# Patient Record
Sex: Male | Born: 1995 | Race: White | Hispanic: No | Marital: Single | Smoking: Never smoker
Health system: Southern US, Community
[De-identification: ages and names within clinical notes are randomized; demographics above are authoritative.]

## PROBLEM LIST (undated history)

## (undated) DIAGNOSIS — K5909 Other constipation: Secondary | ICD-10-CM

## (undated) DIAGNOSIS — R110 Nausea: Secondary | ICD-10-CM

## (undated) DIAGNOSIS — R109 Unspecified abdominal pain: Secondary | ICD-10-CM

## (undated) DIAGNOSIS — J45909 Unspecified asthma, uncomplicated: Secondary | ICD-10-CM

## (undated) HISTORY — PX: HERNIA REPAIR: SHX51

## (undated) HISTORY — DX: Nausea: R11.0

## (undated) HISTORY — DX: Unspecified abdominal pain: R10.9

## (undated) HISTORY — PX: TOOTH EXTRACTION: SUR596

## (undated) HISTORY — DX: Other constipation: K59.09

---
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
## 2017-12-05 ENCOUNTER — Ambulatory Visit (INDEPENDENT_AMBULATORY_CARE_PROVIDER_SITE_OTHER): Payer: Managed Care, Other (non HMO)

## 2017-12-05 ENCOUNTER — Encounter: Payer: Self-pay | Admitting: Emergency Medicine

## 2017-12-05 ENCOUNTER — Ambulatory Visit
Admission: EM | Admit: 2017-12-05 | Discharge: 2017-12-05 | Disposition: A | Discharge status: Home / Self Care | Payer: Managed Care, Other (non HMO) | Attending: Family Medicine | Admitting: Family Medicine

## 2017-12-05 ENCOUNTER — Other Ambulatory Visit: Payer: Self-pay

## 2017-12-05 DIAGNOSIS — S9031XA Contusion of right foot, initial encounter: Secondary | ICD-10-CM

## 2017-12-05 MED ORDER — NAPROXEN 500 MG PO TABS: 500.0000 mg | ORAL_TABLET | Freq: Two times a day (BID) | ORAL | 0 refills | Status: DC | PRN

## 2017-12-05 NOTE — ED Triage Notes (Signed)
Patient c/o foot pain after dropping a log on his right foot. Patient reports swelling and bruising to the top of his right foot.

## 2017-12-05 NOTE — ED Provider Notes (Signed)
[facility]    CSN: [id number] Arrival date & time: 12/05/17  1825  History   Chief Complaint Chief Complaint  Patient presents with  . Foot Injury   HPI  22 year old male presents with a foot injury.  Injury occurred this morning.  Patient states that he was stacking logs of wood.  He dropped on the logs on his foot.  In doing so, he injured his foot.  Reports pain, bruising, and mild swelling on the dorsum of his foot at the level of the first MTP.  Patient is able to walk.  No medications or interventions tried.  No relieving factors.  No other complaints.  PMH, Surgical Hx, Social History reviewed and updated as below.  Past Medical History:  Diagnosis Date  . Abdominal pain, recurrent   . Chronic constipation   . Nausea    Patient Active Problem List   Diagnosis Date Noted  . Abdominal pain, recurrent   . Chronic constipation   . Nausea    Past Surgical History:  Procedure Laterality Date  . HERNIA REPAIR    . TOOTH EXTRACTION      Home Medications    Prior to Admission medications   Medication Sig Start Date End Date Taking? Authorizing Provider  esomeprazole (NEXIUM) 20 MG capsule Take 20 mg by mouth daily before breakfast.     Yes [provider]  methylphenidate (CONCERTA) 27 MG CR tablet Take 27 mg by mouth every morning.     Yes [provider]  naproxen (NAPROSYN) 500 MG tablet Take 1 tablet (500 mg total) by mouth 2 (two) times daily as needed for mild pain or moderate pain. 12/05/17   Tommie Sams, DO   Social History Social History   Tobacco Use  . Smoking status: Never Smoker  . Smokeless tobacco: Never Used  Substance Use Topics  . Alcohol use: Never    Frequency: Never  . Drug use: Never     Allergies   Patient has no known allergies.   Review of Systems Review of Systems  Constitutional: Negative.   Musculoskeletal:       Foot pain, injury (R)   Physical Exam Triage Vital Signs ED Triage Vitals  Enc  Vitals Group     BP 12/05/17 1842 122/82     Pulse Rate 12/05/17 1842 82     Resp 12/05/17 1842 18     Temp 12/05/17 1842 98.1 F (36.7 C)     Temp Source 12/05/17 1842 Oral     SpO2 12/05/17 1842 98 %     Weight 12/05/17 1837 207 lb 6.4 oz (94.1 kg)     Height 12/05/17 1837 6' (1.829 m)     Head Circumference --      Peak Flow --      Pain Score 12/05/17 1837 5     Pain Loc --      Pain Edu? --      Excl. in GC? --    Updated Vital Signs BP 122/82 (BP Location: Left Arm)   Pulse 82   Temp 98.1 F (36.7 C) (Oral)   Resp 18   Ht 6' (1.829 m)   Wt 94.1 kg   SpO2 98%   BMI 28.13 kg/m   Visual Acuity Right Eye Distance:   Left Eye Distance:   Bilateral Distance:    Right Eye Near:   Left Eye Near:    Bilateral Near:     Physical Exam  Constitutional:  He is oriented to person, place, and time. He appears well-developed. No distress.  Cardiovascular: Normal rate and regular rhythm.  Pulmonary/Chest: Effort normal. No respiratory distress.  Musculoskeletal:  Right foot - Mild bruising noted at the 1st MTP. Tender to palpation.  Neurological: He is alert and oriented to person, place, and time.  Psychiatric: He has a normal mood and affect. His behavior is normal.  Nursing note and vitals reviewed.  UC Treatments / Results  Labs (all labs ordered are listed, but only abnormal results are displayed) Labs Reviewed - No data to display  EKG None  Radiology Dg Foot Complete Right  Result Date: 12/05/2017 CLINICAL DATA:  Bruising and pain base of right great toe after dropping a log on foot EXAM: RIGHT FOOT COMPLETE - 3+ VIEW COMPARISON:  None. FINDINGS: No fracture or dislocation of mid foot or forefoot. The phalanges are normal. The calcaneus is normal. No soft tissue abnormality. IMPRESSION: No fracture or dislocation. Electronically Signed   By: Genevive Bi M.D.   On: 12/05/2017 19:01    Procedures Procedures (including critical care time)  Medications  Ordered in UC Medications - No data to display  Initial Impression / Assessment and Plan / UC Course  I have reviewed the triage vital signs and the nursing notes.  Pertinent labs & imaging results that were available during my care of the patient were reviewed by me and considered in my medical decision making (see chart for details).    22 year old male presents with a foot injury.  X-ray was negative.  Patient appears to just have a contusion.  Naproxen as directed.  Supportive care.  Final Clinical Impressions(s) / UC Diagnoses   Final diagnoses:  Contusion of right foot, initial encounter     Discharge Instructions     Rest, ice, elevation.  Naproxen as directed (you can use OTC ibuprofen instead if you like - 800 mg three times daily as needed).  Take care  Dr Adriana Simas    ED Prescriptions    Medication Sig Dispense Auth. Provider   naproxen (NAPROSYN) 500 MG tablet Take 1 tablet (500 mg total) by mouth 2 (two) times daily as needed for mild pain or moderate pain. 30 tablet Tommie Sams, DO     Controlled Substance Prescriptions [place] Controlled Substance Registry consulted? Not Applicable   Tommie Sams, DO 12/05/17 8676

## 2017-12-05 NOTE — Discharge Instructions (Signed)
Rest, ice, elevation.  Naproxen as directed (you can use OTC ibuprofen instead if you like - 800 mg three times daily as needed).  Take care  Dr Adriana Simas

## 2018-01-01 ENCOUNTER — Encounter: Payer: Self-pay | Admitting: Gynecology

## 2018-01-01 ENCOUNTER — Ambulatory Visit
Admission: EM | Admit: 2018-01-01 | Discharge: 2018-01-01 | Disposition: A | Discharge status: Home / Self Care | Payer: Managed Care, Other (non HMO) | Attending: Emergency Medicine | Admitting: Emergency Medicine

## 2018-01-01 ENCOUNTER — Ambulatory Visit (INDEPENDENT_AMBULATORY_CARE_PROVIDER_SITE_OTHER): Payer: Managed Care, Other (non HMO)

## 2018-01-01 DIAGNOSIS — M79672 Pain in left foot: Secondary | ICD-10-CM | POA: Diagnosis not present

## 2018-01-01 DIAGNOSIS — S93491A Sprain of other ligament of right ankle, initial encounter: Secondary | ICD-10-CM | POA: Diagnosis not present

## 2018-01-01 DIAGNOSIS — W19XXXA Unspecified fall, initial encounter: Secondary | ICD-10-CM | POA: Diagnosis not present

## 2018-01-01 HISTORY — DX: Unspecified asthma, uncomplicated: J45.909

## 2018-01-01 NOTE — ED Triage Notes (Signed)
Per patient while sleep walking x 3 days ago fell and injury his left foot. Pt. C/o left foot pain.

## 2018-01-01 NOTE — ED Provider Notes (Signed)
[facility]    CSN: [id number] Arrival date & time: 01/01/18  1154     History   Chief Complaint No chief complaint on file.   HPI Jared Richard is a 22 y.o. male.   HPI  States that he injured his left foot days ago when he fell while sleepwalking.  He landed with his foot in inversion and plantar flexion.  That time is been very painful.  He has been nonweightbearing with 2 crutch gait.  His medical records he had injured his opposite foot when it dropped logs on his foot sustaining a contusion.  Injury had healed.       Past Medical History:  Diagnosis Date  . Abdominal pain, recurrent   . Asthma   . Chronic constipation   . Nausea     Patient Active Problem List   Diagnosis Date Noted  . Abdominal pain, recurrent   . Chronic constipation   . Nausea     Past Surgical History:  Procedure Laterality Date  . HERNIA REPAIR    . TOOTH EXTRACTION         Home Medications    Prior to Admission medications   Medication Sig Start Date End Date Taking? Authorizing Provider  albuterol (PROVENTIL HFA;VENTOLIN HFA) 108 (90 Base) MCG/ACT inhaler Inhale into the lungs.   Yes [provider]  Cholecalciferol (VITAMIN D3) 50000 units CAPS Take 1 capsule by mouth once a week. 12/09/17  Yes [provider]  methylphenidate (CONCERTA) 27 MG CR tablet Take 27 mg by mouth every morning.     Yes [provider]  naproxen (NAPROSYN) 500 MG tablet Take 1 tablet (500 mg total) by mouth 2 (two) times daily as needed for mild pain or moderate pain. 12/05/17  Yes Tommie Sams, DO    Family History Family History  Problem Relation Age of Onset  . Healthy Mother   . Healthy Father     Social History Social History   Tobacco Use  . Smoking status: Never Smoker  . Smokeless tobacco: Never Used  Substance Use Topics  . Alcohol use: Never    Frequency: Never  . Drug use: Never     Allergies   Patient has no known  allergies.   Review of Systems Review of Systems  Constitutional: Positive for activity change. Negative for appetite change, chills, fatigue and fever.  Musculoskeletal: Positive for arthralgias, gait problem and joint swelling.  All other systems reviewed and are negative.    Physical Exam Triage Vital Signs ED Triage Vitals  Enc Vitals Group     BP 01/01/18 1227 117/72     Pulse Rate 01/01/18 1227 69     Resp 01/01/18 1227 16     Temp 01/01/18 1227 98.5 F (36.9 C)     Temp Source 01/01/18 1227 Oral     SpO2 01/01/18 1227 100 %     Weight 01/01/18 1228 210 lb (95.3 kg)     Height --      Head Circumference --      Peak Flow --      Pain Score 01/01/18 1228 5     Pain Loc --      Pain Edu? --      Excl. in GC? --    No data found.  Updated Vital Signs BP 117/72 (BP Location: Left Arm)   Pulse 69   Temp 98.5 F (36.9 C) (Oral)   Resp 16   Wt  210 lb (95.3 kg)   SpO2 100%   BMI 28.48 kg/m   Visual Acuity Right Eye Distance:   Left Eye Distance:   Bilateral Distance:    Right Eye Near:   Left Eye Near:    Bilateral Near:     Physical Exam  Constitutional: He appears well-developed and well-nourished. No distress.  HENT:  Head: Normocephalic.  Eyes: Pupils are equal, round, and reactive to light. EOM are normal. Right eye exhibits no discharge. Left eye exhibits no discharge.  Neck: Normal range of motion.  Musculoskeletal: He exhibits edema and tenderness.  Examination of the left ankle foot shows swelling and ecchymosis over the torso lateral to the anterior fibulotalar ligament.  There is maximal tenderness in this area.  Is a decreased range of motion is able to dorsiflex to neutral plantar flex to approximately 30 degrees.  Subtalar motion is intact but painful with inversion.  Skin: He is not diaphoretic.  Nursing note and vitals reviewed.    UC Treatments / Results  Labs (all labs ordered are listed, but only abnormal results are  displayed) Labs Reviewed - No data to display  EKG None  Radiology Dg Foot Complete Left  Result Date: 01/01/2018 CLINICAL DATA:  Pain following fall EXAM: LEFT FOOT - COMPLETE 3+ VIEW COMPARISON:  None. FINDINGS: Frontal, oblique, and lateral views were obtained. No acute fracture or dislocation. Joint spaces appear normal. No erosive change. A small calcification dorsal to the distal talus potentially may represent residua of old trauma. IMPRESSION: No evident acute fracture or dislocation. Well corticated focus of calcification up dorsal to the distal talus may represent residua of old trauma. No appreciable arthropathy. Electronically Signed   By: Bretta Bang III M.D.   On: 01/01/2018 13:28    Procedures Procedures (including critical care time)  Medications Ordered in UC Medications - No data to display  Initial Impression / Assessment and Plan / UC Course  I have reviewed the triage vital signs and the nursing notes.  Pertinent labs & imaging results that were available during my care of the patient were reviewed by me and considered in my medical decision making (see chart for details).     Apply ice 20 minutes out of every 2 hours 4-5 times daily for comfort.  Elevate your foot above your heart sufficiently to control swelling and pain.  From the examination I suspect that the calcification dorsal to the talus being reported as possible old, is actually new.  Recommended that he use crutches to weight-bear touchdown to full weightbearing as tolerated.  Patient did not do wish to have a boot orthosis but states elected to have an Ace wrap.  If not improving he should follow-up with the podiatry.  Anti-inflammatory medication left over from his previous contusion that he will take.       Final Clinical Impressions(s) / UC Diagnoses   Final diagnoses:  Sprain of anterior talofibular ligament of right ankle, initial encounter     Discharge Instructions     Start  using touchdown gait with your crutches and advancing to full weightbearing as tolerated.Apply ice 20 minutes out of every 2 hours 4-5 times daily for comfort.  Elevate your foot sufficiently to control swelling and pain.  If you are not improving follow-up with podiatrist   ED Prescriptions    None     Controlled Substance Prescriptions [place] Controlled Substance Registry consulted? Not Applicable   Lutricia Feil, PA-C 01/01/18 1408

## 2018-01-01 NOTE — Discharge Instructions (Signed)
Start using touchdown gait with your crutches and advancing to full weightbearing as tolerated.Apply ice 20 minutes out of every 2 hours 4-5 times daily for comfort.  Elevate your foot sufficiently to control swelling and pain.  If you are not improving follow-up with podiatrist

## 2019-06-10 IMAGING — CR DG FOOT COMPLETE 3+V*L*
3 series · 3 of 3 positions shown · non-contrast
Comparison: None.

CLINICAL DATA: Pain following fall

EXAM:
LEFT FOOT - COMPLETE 3+ VIEW

[foot ap]
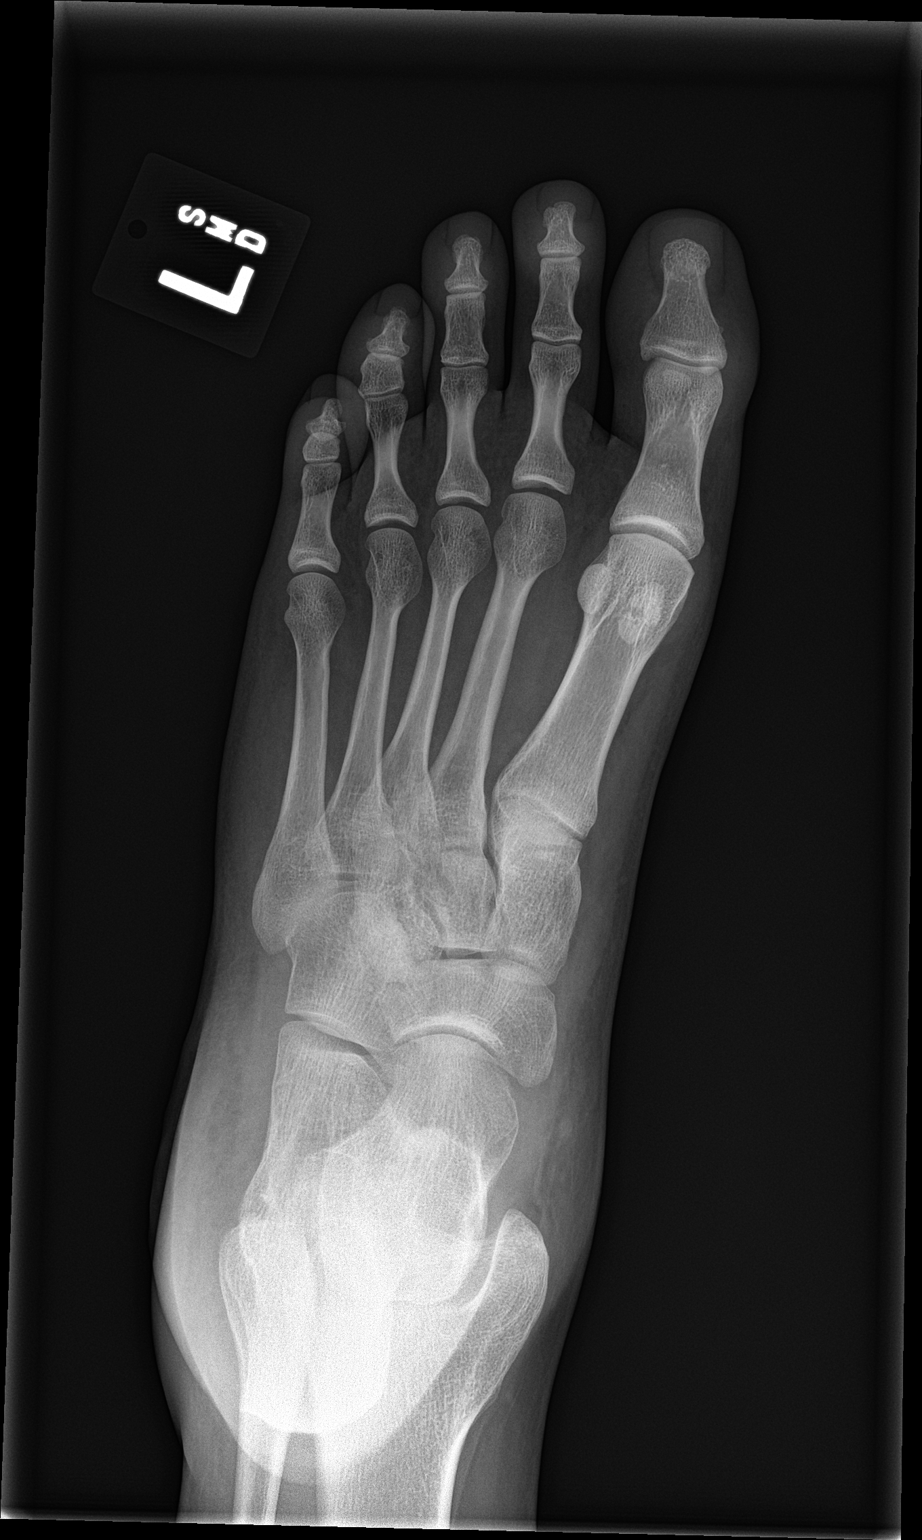

[foot obl]
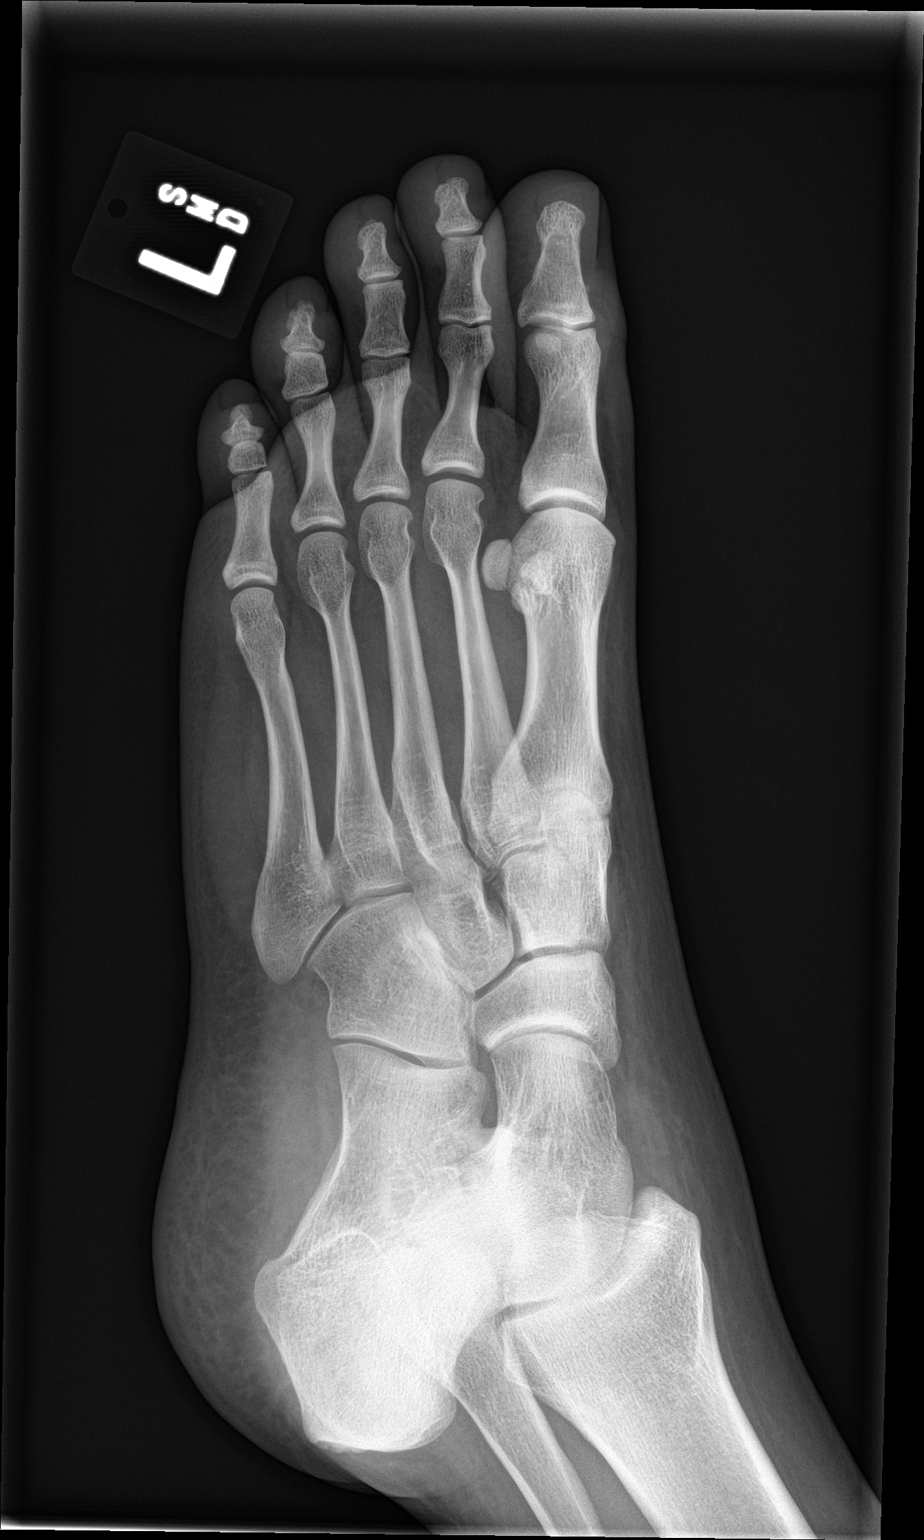

[foot lat]
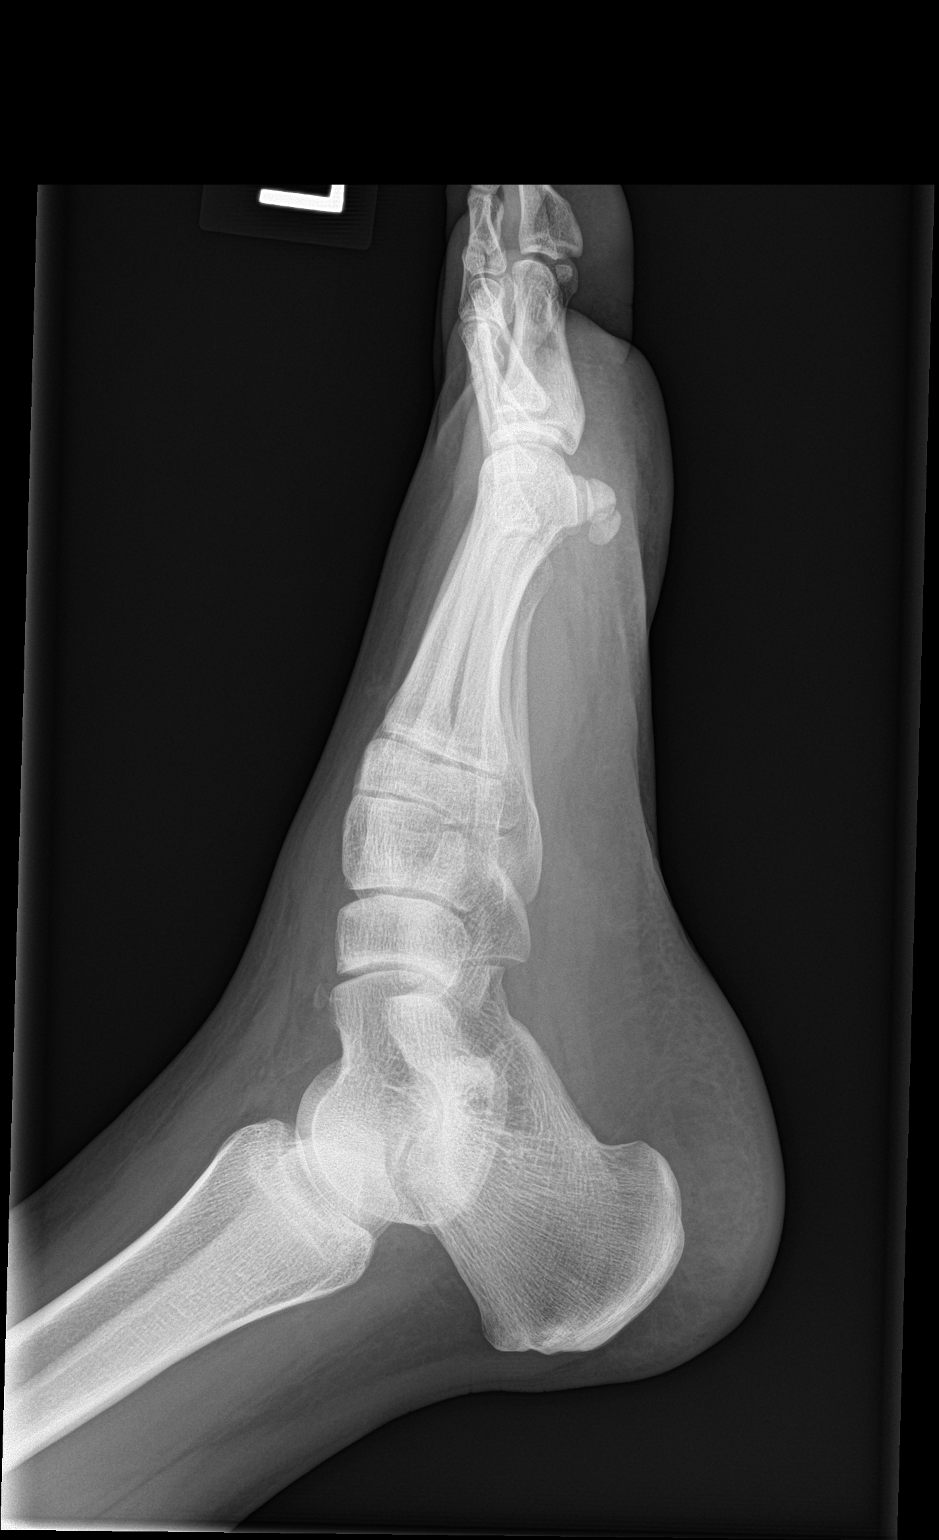

[3 of 3 positions shown; findings below may reference images not displayed]

FINDINGS: Frontal, oblique, and lateral views were obtained. No acute fracture
or dislocation. Joint spaces appear normal. No erosive change. A
small calcification dorsal to the distal talus potentially may
represent residua of old trauma.
IMPRESSION: No evident acute fracture or dislocation. Well corticated focus of
calcification up dorsal to the distal talus may represent residua of
old trauma. No appreciable arthropathy.

## 2020-06-02 ENCOUNTER — Ambulatory Visit (INDEPENDENT_AMBULATORY_CARE_PROVIDER_SITE_OTHER): Payer: Self-pay

## 2020-06-02 ENCOUNTER — Ambulatory Visit
Admission: EM | Admit: 2020-06-02 | Discharge: 2020-06-02 | Disposition: A | Discharge status: Home / Self Care | Payer: Self-pay | Attending: Physician Assistant | Admitting: Physician Assistant

## 2020-06-02 ENCOUNTER — Other Ambulatory Visit: Payer: Self-pay

## 2020-06-02 ENCOUNTER — Encounter: Payer: Self-pay | Admitting: Emergency Medicine

## 2020-06-02 DIAGNOSIS — M79671 Pain in right foot: Secondary | ICD-10-CM

## 2020-06-02 MED ORDER — NAPROXEN 500 MG PO TABS: 500.0000 mg | ORAL_TABLET | Freq: Two times a day (BID) | ORAL | 0 refills | Status: AC

## 2020-06-02 NOTE — ED Triage Notes (Signed)
Pt c/i right foot pain. Pain is located on the bottom in the arch of his foot. Started about a week ago but worse yesterday.  Denies injury.

## 2020-06-02 NOTE — ED Provider Notes (Signed)
[facility]    CSN: [id number] Arrival date & time: 06/02/20  1201      History   Chief Complaint Chief Complaint  Patient presents with  . Foot Pain    right    HPI Jared Richard is a 25 y.o. male presenting for pain of the arch of the right foot x 1 week.  Pain has been worsening over the past couple of days.  He denies any injury.   Says he has some pain on weightbearing.  Pain improved with elevation and resting.  He has iced it once or twice.  He denies taking any medication for pain relief.  Patient denies any numbness, weakness or tingling.  Patient denies any major injuries to this foot or ankle in the past.  He denies any other complaints or concerns today.  HPI  Past Medical History:  Diagnosis Date  . Abdominal pain, recurrent   . Asthma   . Chronic constipation   . Nausea     Patient Active Problem List   Diagnosis Date Noted  . Abdominal pain, recurrent   . Chronic constipation   . Nausea     Past Surgical History:  Procedure Laterality Date  . HERNIA REPAIR    . TOOTH EXTRACTION         Home Medications    Prior to Admission medications   Medication Sig Start Date End Date Taking? Authorizing Provider  albuterol (PROVENTIL HFA;VENTOLIN HFA) 108 (90 Base) MCG/ACT inhaler Inhale into the lungs.   Yes [provider]  naproxen (NAPROSYN) 500 MG tablet Take 1 tablet (500 mg total) by mouth 2 (two) times daily for 10 days. 06/02/20 06/12/20 Yes Shirlee Latch, PA-C  Cholecalciferol (VITAMIN D3) 50000 units CAPS Take 1 capsule by mouth once a week. 12/09/17   [provider]  methylphenidate (CONCERTA) 27 MG CR tablet Take 27 mg by mouth every morning.    [provider]    Family History Family History  Problem Relation Age of Onset  . Healthy Mother   . Healthy Father     Social History Social History   Tobacco Use  . Smoking status: Never Smoker  . Smokeless tobacco: Never Used  Vaping Use  . Vaping  Use: Never used  Substance Use Topics  . Alcohol use: Never  . Drug use: Never     Allergies   Patient has no known allergies.   Review of Systems Review of Systems  Musculoskeletal: Positive for arthralgias, gait problem and joint swelling. Negative for myalgias.  Skin: Negative for color change, rash and wound.  Neurological: Negative for weakness and numbness.  Psychiatric/Behavioral: Behavioral problem:      Physical Exam Triage Vital Signs ED Triage Vitals  Enc Vitals Group     BP 06/02/20 1222 (!) 141/83     Pulse Rate 06/02/20 1222 87     Resp 06/02/20 1222 18     Temp 06/02/20 1222 98 F (36.7 C)     Temp Source 06/02/20 1222 Oral     SpO2 06/02/20 1222 100 %     Weight 06/02/20 1220 210 lb 1.6 oz (95.3 kg)     Height 06/02/20 1220 6' (1.829 m)     Head Circumference --      Peak Flow --      Pain Score 06/02/20 1219 8     Pain Loc --      Pain Edu? --      Excl.  in GC? --    No data found.  Updated Vital Signs BP (!) 141/83 (BP Location: Left Arm)   Pulse 87   Temp 98 F (36.7 C) (Oral)   Resp 18   Ht 6' (1.829 m)   Wt 210 lb 1.6 oz (95.3 kg)   SpO2 100%   BMI 28.49 kg/m       Physical Exam Vitals and nursing note reviewed.  Constitutional:      General: He is not in acute distress.    Appearance: Normal appearance. He is well-developed and well-nourished. He is not ill-appearing.  HENT:     Head: Normocephalic and atraumatic.  Eyes:     General: No scleral icterus.    Conjunctiva/sclera: Conjunctivae normal.  Cardiovascular:     Rate and Rhythm: Normal rate and regular rhythm.     Pulses: Normal pulses.  Pulmonary:     Effort: Pulmonary effort is normal. No respiratory distress.     Breath sounds: Normal breath sounds.  Musculoskeletal:        General: No edema.     Cervical back: Neck supple.     Right foot: Decreased range of motion. Swelling (mild swelling medial plantar foot) and tenderness (medial plantar foot and medial ankle)  present. Normal pulse.  Skin:    General: Skin is warm and dry.  Neurological:     General: No focal deficit present.     Mental Status: He is alert. Mental status is at baseline.     Motor: No weakness.     Gait: Gait abnormal.  Psychiatric:        Mood and Affect: Mood and affect and mood normal.        Behavior: Behavior normal.        Thought Content: Thought content normal.      UC Treatments / Results  Labs (all labs ordered are listed, but only abnormal results are displayed) Labs Reviewed - No data to display  EKG   Radiology DG Foot Complete Right  Result Date: 06/02/2020 CLINICAL DATA:  Right foot pain. EXAM: RIGHT FOOT COMPLETE - 3+ VIEW COMPARISON:  None. FINDINGS: There is no evidence of fracture or dislocation. There is no evidence of arthropathy or other focal bone abnormality. Soft tissues are unremarkable. IMPRESSION: Negative. Electronically Signed   By: Kennith Center M.D.   On: 06/02/2020 12:46    Procedures Procedures (including critical care time)  Medications Ordered in UC Medications - No data to display  Initial Impression / Assessment and Plan / UC Course  I have reviewed the triage vital signs and the nursing notes.  Pertinent labs & imaging results that were available during my care of the patient were reviewed by me and considered in my medical decision making (see chart for details).   25 year old presenting for atraumatic right foot pain x1 week.  X-ray of foot is negative for any acute abnormality.  I reviewed with patient.  Advised supportive care at this time with RICE and I have sent naproxen to pharmacy for pain and inflammation.  Has been given an Ace wrap in the clinic.  Advised he can also take Tylenol if needed.  School note given.  Advised patient that if is not getting better the next 7 to 10 days or if his symptoms worsen he should follow-up with PCP or [facility].  Patient agreeable.   Final Clinical Impressions(s) / UC  Diagnoses   Final diagnoses:  Foot pain, right  Discharge Instructions     X-rays are normal. You should rest and ice the foot. Use the ACE wrap for compression and take the NSAIDs as prescribed. Can also take Tylenol. If not better in 7-10 days you should be seen again. Go to PCP or [facility] walk in urgent care in [place].     ED Prescriptions    Medication Sig Dispense Auth. Provider   naproxen (NAPROSYN) 500 MG tablet Take 1 tablet (500 mg total) by mouth 2 (two) times daily for 10 days. 20 tablet Gareth Morgan     PDMP not reviewed this encounter.   Shirlee Latch, PA-C 06/02/20 1324

## 2020-06-02 NOTE — Discharge Instructions (Signed)
X-rays are normal. You should rest and ice the foot. Use the ACE wrap for compression and take the NSAIDs as prescribed. Can also take Tylenol. If not better in 7-10 days you should be seen again. Go to PCP or [facility] walk in urgent care in [place].

## 2021-11-09 IMAGING — CR DG FOOT COMPLETE 3+V*R*
3 series · 3 of 3 positions shown · non-contrast
Comparison: None.

CLINICAL DATA: Right foot pain.

EXAM:
RIGHT FOOT COMPLETE - 3+ VIEW

[foot ap]
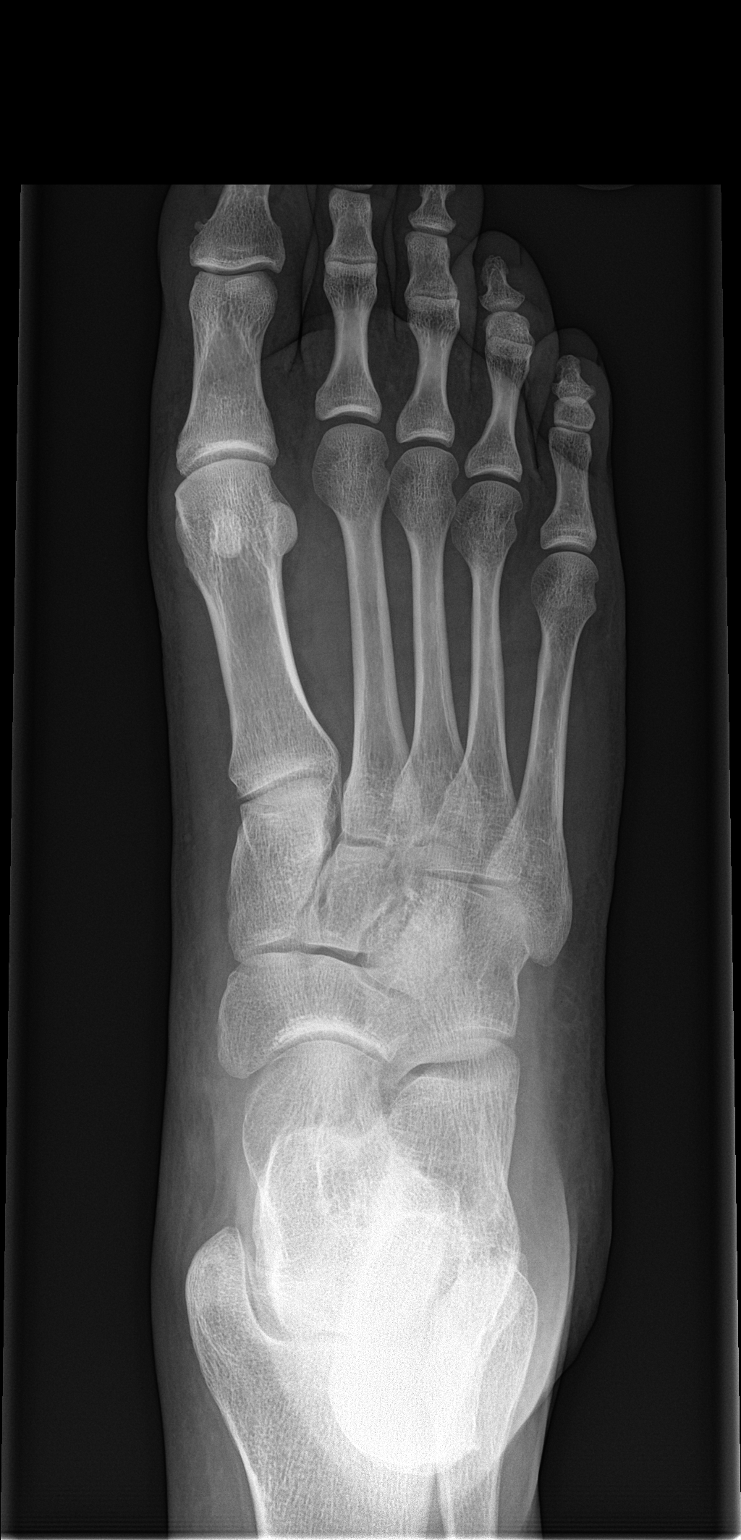

[foot obl]
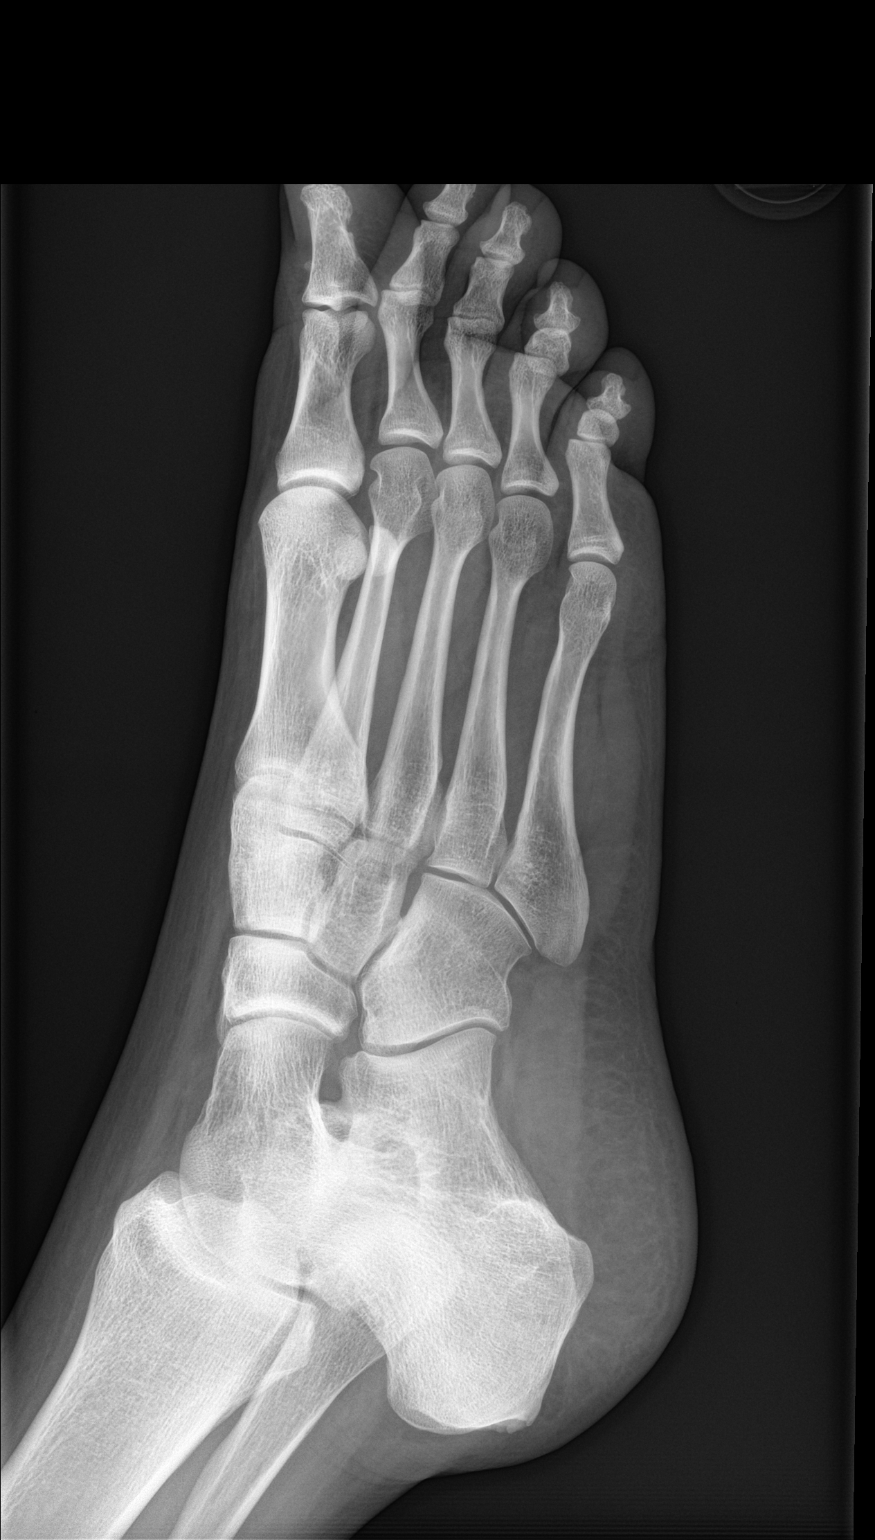

[foot lat]
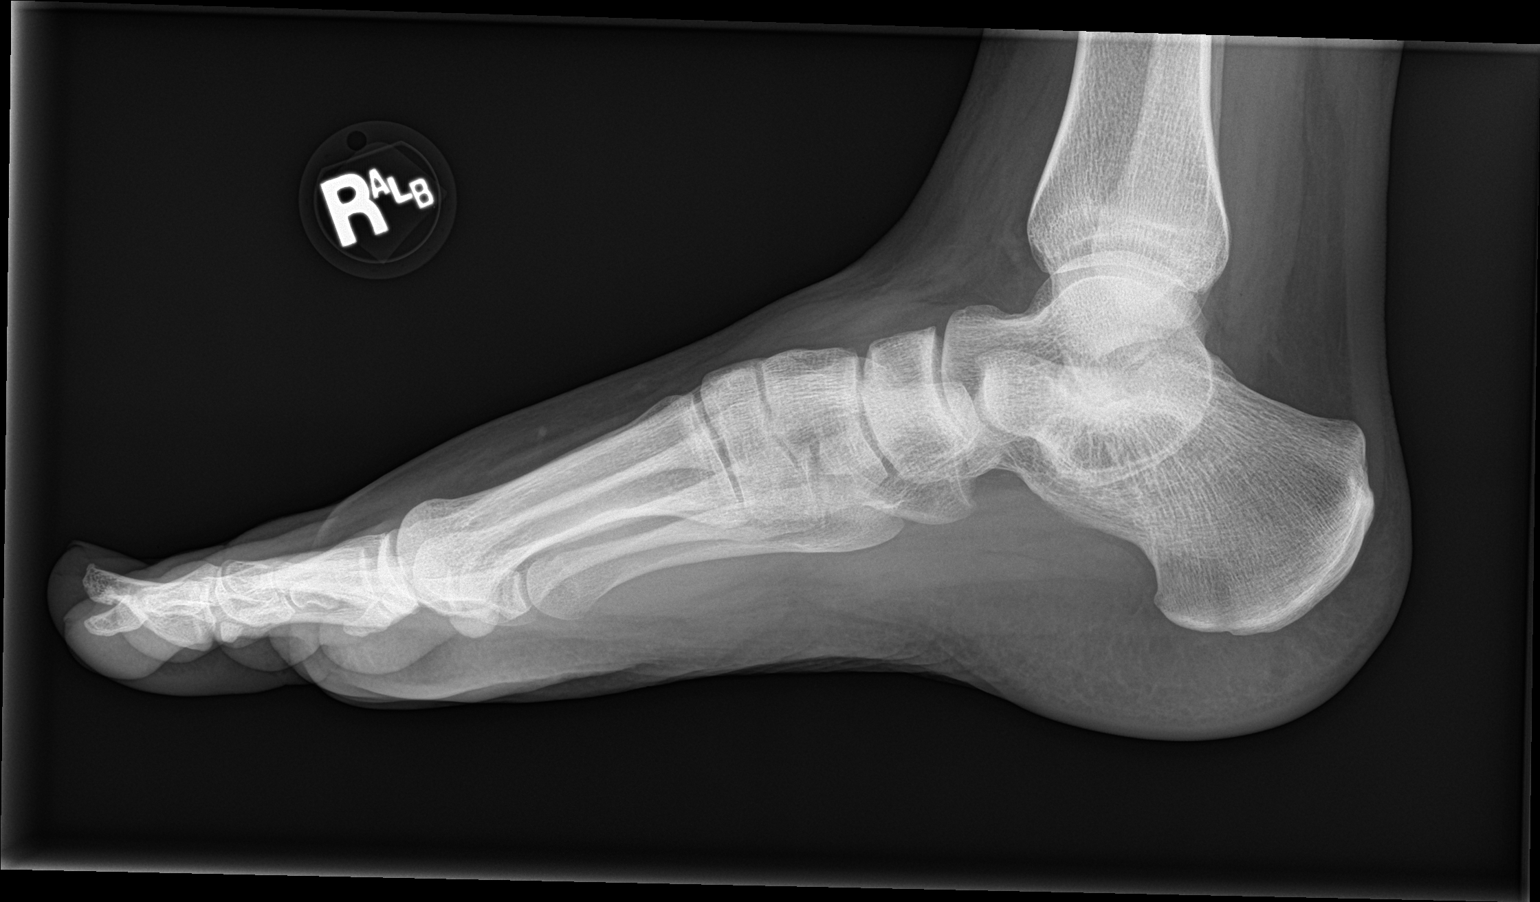

[3 of 3 positions shown; findings below may reference images not displayed]

FINDINGS: There is no evidence of fracture or dislocation. There is no
evidence of arthropathy or other focal bone abnormality. Soft
tissues are unremarkable.
IMPRESSION: Negative.

## 2022-06-01 ENCOUNTER — Ambulatory Visit: Payer: Self-pay | Admitting: Urology
